# Patient Record
Sex: Female | Born: 1945 | Race: Black or African American | Hispanic: No | Marital: Married | Smoking: Never smoker
Health system: Southern US, Community
[De-identification: ages and names within clinical notes are randomized; demographics above are authoritative.]

## PROBLEM LIST (undated history)

## (undated) DIAGNOSIS — I10 Essential (primary) hypertension: Secondary | ICD-10-CM

## (undated) HISTORY — PX: BREAST CYST ASPIRATION: SHX578

## (undated) HISTORY — PX: ABDOMINAL HYSTERECTOMY: SHX81

---

## 2010-02-26 ENCOUNTER — Ambulatory Visit: Payer: Self-pay

## 2010-03-11 ENCOUNTER — Ambulatory Visit: Payer: Self-pay

## 2011-08-10 ENCOUNTER — Ambulatory Visit: Payer: Self-pay | Admitting: Family Medicine

## 2013-02-22 ENCOUNTER — Ambulatory Visit: Payer: Self-pay

## 2013-03-15 ENCOUNTER — Ambulatory Visit: Payer: Self-pay

## 2014-05-31 ENCOUNTER — Ambulatory Visit: Payer: Self-pay | Admitting: Family Medicine

## 2016-02-05 ENCOUNTER — Other Ambulatory Visit: Payer: Self-pay | Admitting: Family Medicine

## 2016-02-05 DIAGNOSIS — Z139 Encounter for screening, unspecified: Secondary | ICD-10-CM

## 2016-02-21 ENCOUNTER — Other Ambulatory Visit: Payer: Self-pay | Admitting: Family Medicine

## 2016-02-21 ENCOUNTER — Ambulatory Visit
Admission: RE | Admit: 2016-02-21 | Discharge: 2016-02-21 | Disposition: A | Discharge status: Home / Self Care | Payer: Medicare HMO | Source: Ambulatory Visit | Attending: Family Medicine | Admitting: Family Medicine

## 2016-02-21 DIAGNOSIS — Z139 Encounter for screening, unspecified: Secondary | ICD-10-CM

## 2016-02-21 DIAGNOSIS — Z1231 Encounter for screening mammogram for malignant neoplasm of breast: Secondary | ICD-10-CM | POA: Insufficient documentation

## 2016-06-29 ENCOUNTER — Other Ambulatory Visit: Payer: Self-pay | Admitting: Family Medicine

## 2016-06-29 DIAGNOSIS — Z13820 Encounter for screening for osteoporosis: Secondary | ICD-10-CM

## 2016-07-22 ENCOUNTER — Ambulatory Visit
Admission: RE | Admit: 2016-07-22 | Discharge: 2016-07-22 | Disposition: A | Discharge status: Home / Self Care | Payer: Medicare HMO | Source: Ambulatory Visit | Attending: Family Medicine | Admitting: Family Medicine

## 2016-07-22 DIAGNOSIS — Z13820 Encounter for screening for osteoporosis: Secondary | ICD-10-CM | POA: Insufficient documentation

## 2016-07-22 DIAGNOSIS — M85852 Other specified disorders of bone density and structure, left thigh: Secondary | ICD-10-CM | POA: Insufficient documentation

## 2017-01-15 ENCOUNTER — Other Ambulatory Visit: Payer: Self-pay | Admitting: Family Medicine

## 2017-01-15 DIAGNOSIS — Z1231 Encounter for screening mammogram for malignant neoplasm of breast: Secondary | ICD-10-CM

## 2017-02-23 ENCOUNTER — Ambulatory Visit
Admission: RE | Admit: 2017-02-23 | Discharge: 2017-02-23 | Disposition: A | Discharge status: Home / Self Care | Payer: Medicare HMO | Source: Ambulatory Visit | Attending: Family Medicine | Admitting: Family Medicine

## 2017-02-23 DIAGNOSIS — Z1231 Encounter for screening mammogram for malignant neoplasm of breast: Secondary | ICD-10-CM | POA: Diagnosis present

## 2018-01-17 ENCOUNTER — Other Ambulatory Visit: Payer: Self-pay | Admitting: Family Medicine

## 2018-01-17 DIAGNOSIS — Z1231 Encounter for screening mammogram for malignant neoplasm of breast: Secondary | ICD-10-CM

## 2018-02-25 ENCOUNTER — Ambulatory Visit
Admission: RE | Admit: 2018-02-25 | Discharge: 2018-02-25 | Disposition: A | Discharge status: Home / Self Care | Payer: Medicare HMO | Source: Ambulatory Visit | Attending: Family Medicine | Admitting: Family Medicine

## 2018-02-25 DIAGNOSIS — Z1231 Encounter for screening mammogram for malignant neoplasm of breast: Secondary | ICD-10-CM | POA: Diagnosis not present

## 2018-09-15 ENCOUNTER — Other Ambulatory Visit: Payer: Self-pay | Admitting: Family Medicine

## 2018-09-15 DIAGNOSIS — Z13820 Encounter for screening for osteoporosis: Secondary | ICD-10-CM

## 2018-10-19 ENCOUNTER — Ambulatory Visit
Admission: RE | Admit: 2018-10-19 | Discharge: 2018-10-19 | Disposition: A | Discharge status: Home / Self Care | Payer: Medicare HMO | Source: Ambulatory Visit | Attending: Family Medicine | Admitting: Family Medicine

## 2018-10-19 DIAGNOSIS — M85852 Other specified disorders of bone density and structure, left thigh: Secondary | ICD-10-CM | POA: Insufficient documentation

## 2018-10-19 DIAGNOSIS — Z13820 Encounter for screening for osteoporosis: Secondary | ICD-10-CM

## 2018-10-19 DIAGNOSIS — M8588 Other specified disorders of bone density and structure, other site: Secondary | ICD-10-CM | POA: Insufficient documentation

## 2019-01-18 ENCOUNTER — Other Ambulatory Visit: Payer: Self-pay | Admitting: Family Medicine

## 2019-01-18 DIAGNOSIS — Z1231 Encounter for screening mammogram for malignant neoplasm of breast: Secondary | ICD-10-CM

## 2019-06-06 ENCOUNTER — Other Ambulatory Visit: Payer: Self-pay

## 2019-06-06 ENCOUNTER — Ambulatory Visit
Admission: RE | Admit: 2019-06-06 | Discharge: 2019-06-06 | Disposition: A | Discharge status: Home / Self Care | Payer: Medicare HMO | Source: Ambulatory Visit | Attending: Family Medicine | Admitting: Family Medicine

## 2019-06-06 DIAGNOSIS — Z1231 Encounter for screening mammogram for malignant neoplasm of breast: Secondary | ICD-10-CM

## 2020-04-30 ENCOUNTER — Other Ambulatory Visit: Payer: Self-pay | Admitting: Family Medicine

## 2020-04-30 DIAGNOSIS — Z1231 Encounter for screening mammogram for malignant neoplasm of breast: Secondary | ICD-10-CM

## 2020-06-10 ENCOUNTER — Ambulatory Visit
Admission: RE | Admit: 2020-06-10 | Discharge: 2020-06-10 | Disposition: A | Discharge status: Home / Self Care | Payer: Medicare HMO | Source: Ambulatory Visit | Attending: Family Medicine | Admitting: Family Medicine

## 2020-06-10 DIAGNOSIS — Z1231 Encounter for screening mammogram for malignant neoplasm of breast: Secondary | ICD-10-CM | POA: Diagnosis present

## 2021-05-14 ENCOUNTER — Other Ambulatory Visit: Payer: Self-pay | Admitting: Family Medicine

## 2021-05-14 DIAGNOSIS — Z1231 Encounter for screening mammogram for malignant neoplasm of breast: Secondary | ICD-10-CM

## 2021-06-11 ENCOUNTER — Other Ambulatory Visit: Payer: Self-pay

## 2021-06-11 ENCOUNTER — Ambulatory Visit
Admission: RE | Admit: 2021-06-11 | Discharge: 2021-06-11 | Disposition: A | Discharge status: Home / Self Care | Payer: Medicare HMO | Source: Ambulatory Visit | Attending: Family Medicine | Admitting: Family Medicine

## 2021-06-11 DIAGNOSIS — Z1231 Encounter for screening mammogram for malignant neoplasm of breast: Secondary | ICD-10-CM | POA: Diagnosis present

## 2021-06-17 ENCOUNTER — Other Ambulatory Visit: Payer: Self-pay | Admitting: Family Medicine

## 2021-06-17 DIAGNOSIS — R928 Other abnormal and inconclusive findings on diagnostic imaging of breast: Secondary | ICD-10-CM

## 2021-06-17 DIAGNOSIS — N6489 Other specified disorders of breast: Secondary | ICD-10-CM

## 2021-06-19 ENCOUNTER — Ambulatory Visit
Admission: RE | Admit: 2021-06-19 | Discharge: 2021-06-19 | Disposition: A | Discharge status: Home / Self Care | Payer: Medicare HMO | Source: Ambulatory Visit | Attending: Family Medicine | Admitting: Family Medicine

## 2021-06-19 ENCOUNTER — Other Ambulatory Visit: Payer: Self-pay

## 2021-06-19 DIAGNOSIS — R928 Other abnormal and inconclusive findings on diagnostic imaging of breast: Secondary | ICD-10-CM

## 2021-06-19 DIAGNOSIS — N6489 Other specified disorders of breast: Secondary | ICD-10-CM | POA: Insufficient documentation

## 2021-09-15 ENCOUNTER — Other Ambulatory Visit: Payer: Self-pay | Admitting: Family Medicine

## 2021-09-15 DIAGNOSIS — Z13820 Encounter for screening for osteoporosis: Secondary | ICD-10-CM

## 2021-11-15 IMAGING — MG MM DIGITAL DIAGNOSTIC UNILAT*L* W/ TOMO W/ CAD
4 series · 4 of 12 positions shown · non-contrast
Comparison: Previous exam(s).

CLINICAL DATA: Callback for LEFT breast asymmetry seen on MLO view
only

EXAM:
DIGITAL DIAGNOSTIC UNILATERAL LEFT MAMMOGRAM WITH TOMOSYNTHESIS AND
CAD
TECHNIQUE: Left digital diagnostic mammography and breast tomosynthesis was
performed. The images were evaluated with computer-aided detection.

[L MLO synth-2D]
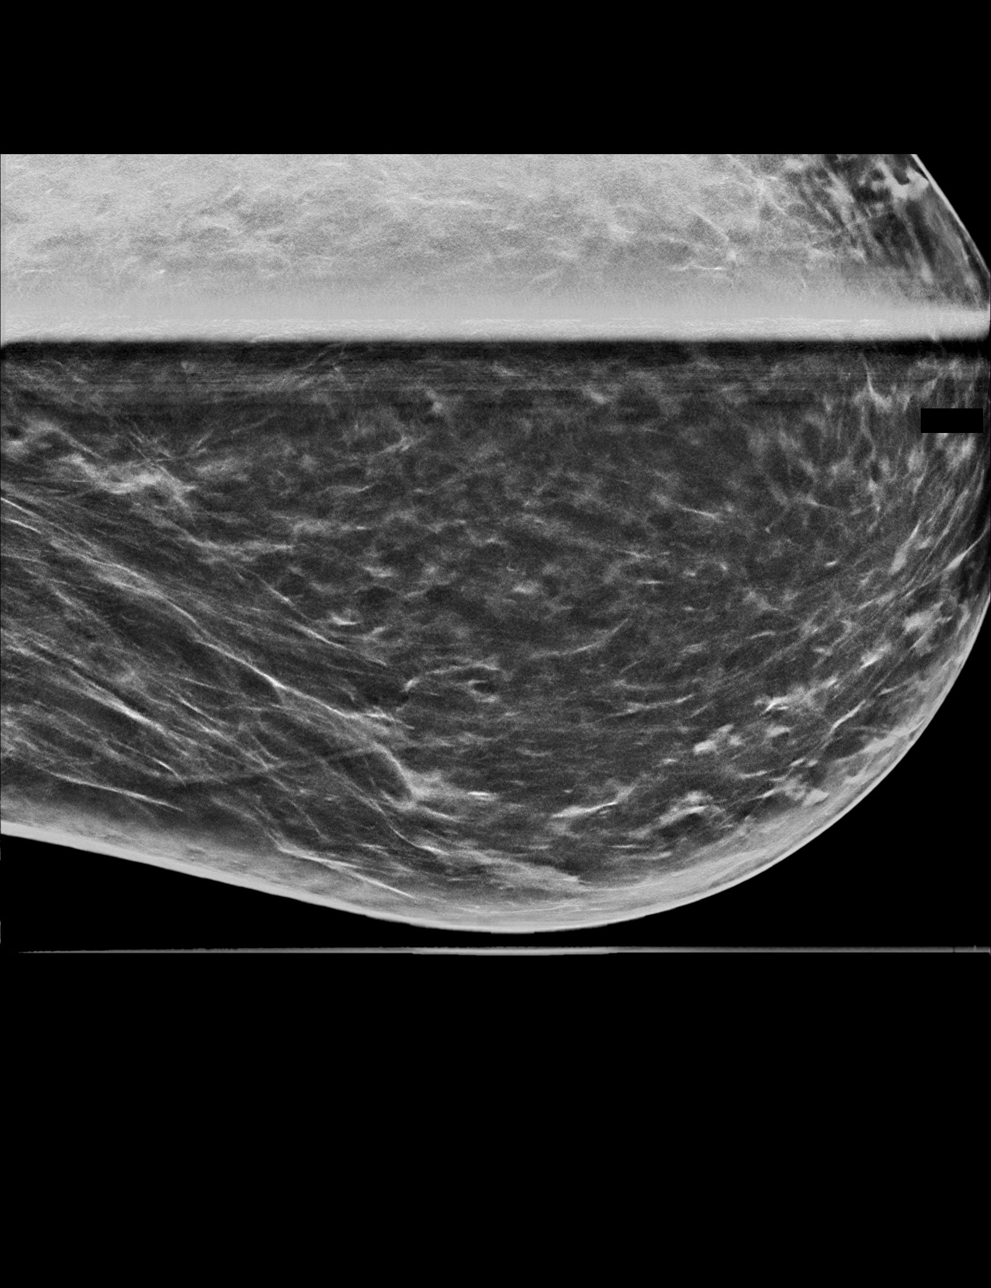

[L ML synth-2D]
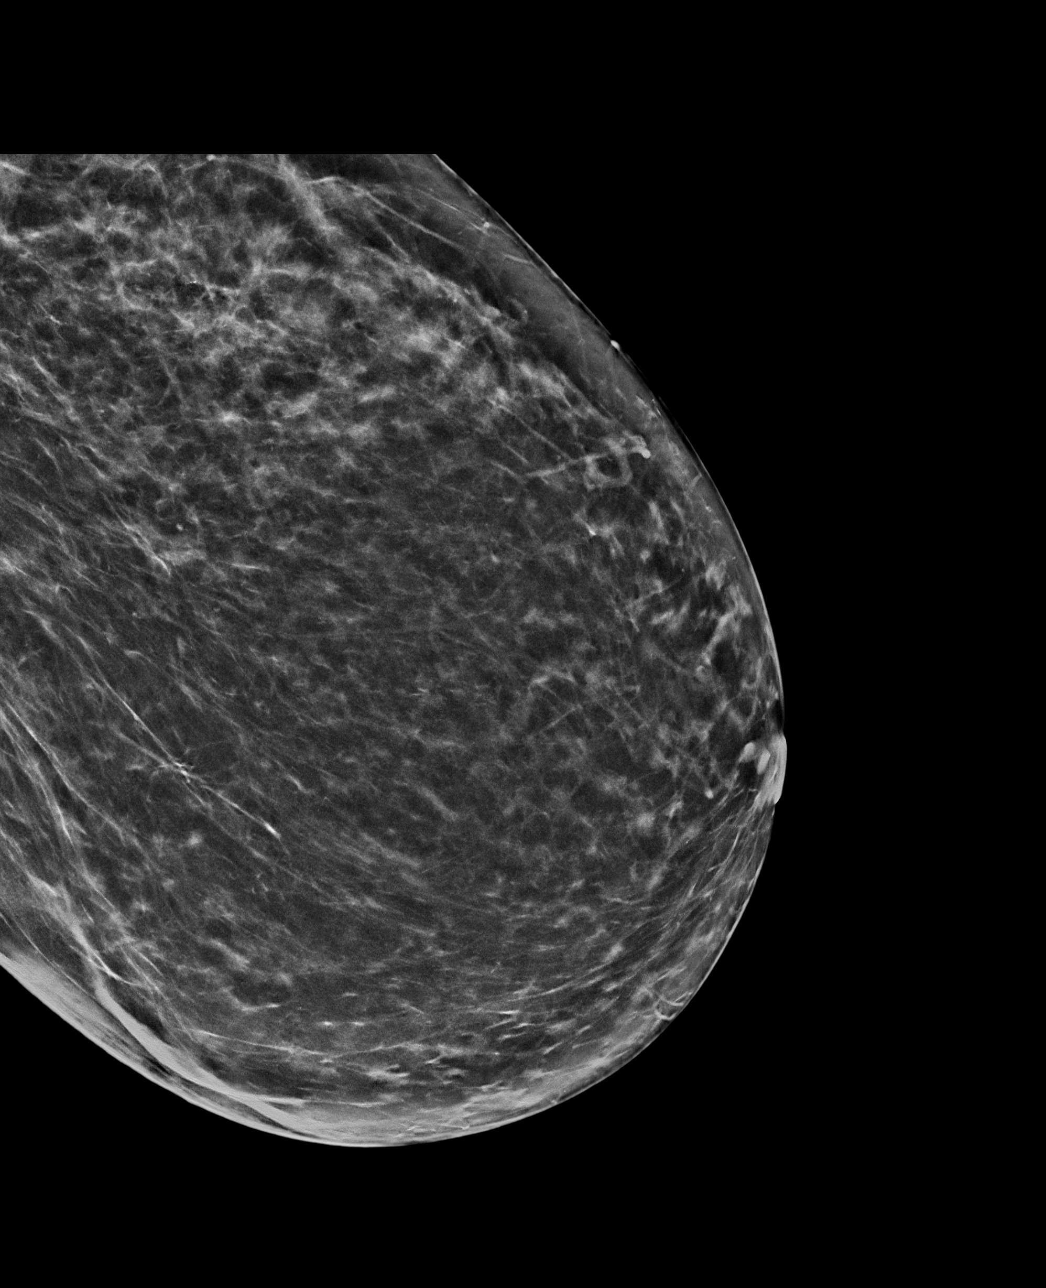

[L ML tomo · tomo slice 37/72.0]
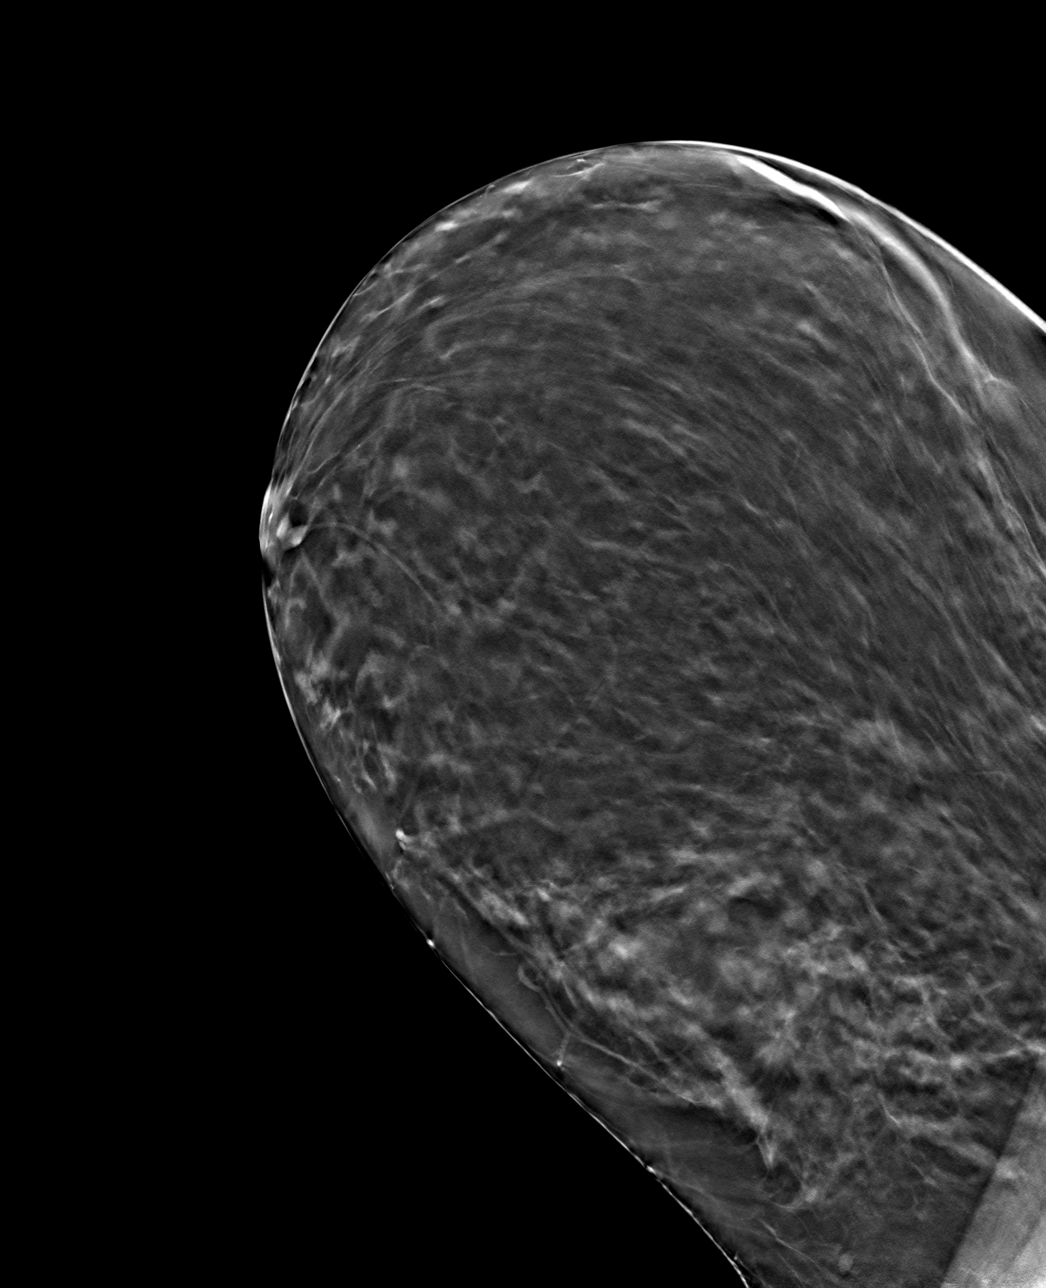

[L MLO tomo · tomo slice 35/70.0]
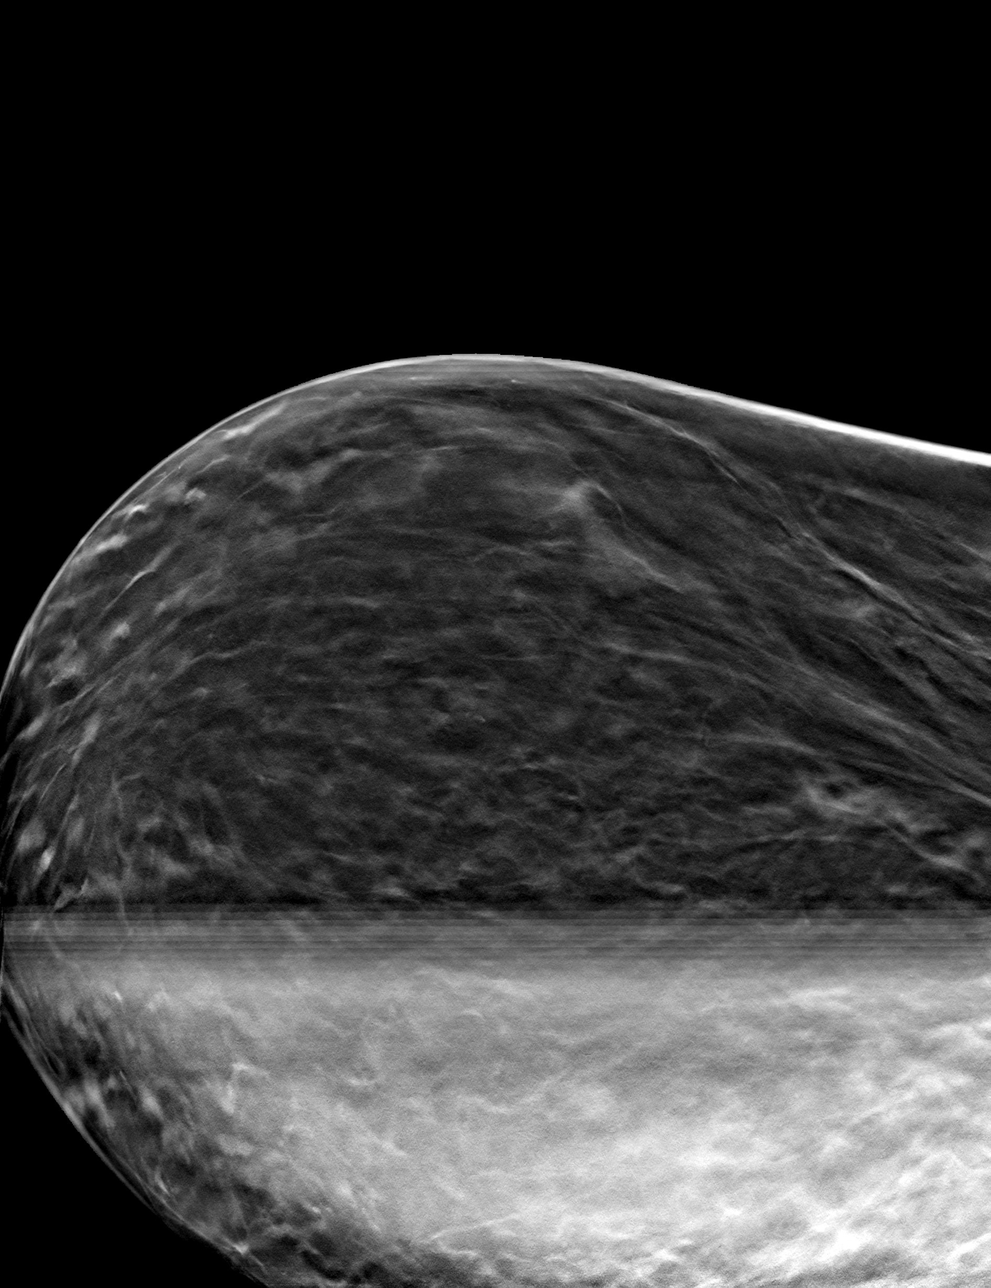

[4 of 12 positions shown; findings below may reference images not displayed]

ACR Breast Density Category c: The breast tissue is heterogeneously
dense, which may obscure small masses.
FINDINGS: The previously described finding does not persist with additional
views, consistent with superimposed fibroglandular tissue.
Fibroglandular tissue assumes a configuration stable in comparison
to remote prior mammograms. No suspicious mass, microcalcification,
or other finding is identified.
IMPRESSION: No mammographic evidence of malignancy.

RECOMMENDATION:
Screening mammogram in one year.(Code:X2-L-3OF)

I have discussed the findings and recommendations with the patient.
If applicable, a reminder letter will be sent to the patient
regarding the next appointment.

BI-RADS CATEGORY  1: Negative.

## 2021-11-17 DIAGNOSIS — E7800 Pure hypercholesterolemia, unspecified: Secondary | ICD-10-CM | POA: Insufficient documentation

## 2021-11-17 DIAGNOSIS — I10 Essential (primary) hypertension: Secondary | ICD-10-CM | POA: Insufficient documentation

## 2022-05-12 ENCOUNTER — Other Ambulatory Visit: Payer: Self-pay | Admitting: Family Medicine

## 2022-05-12 DIAGNOSIS — Z1231 Encounter for screening mammogram for malignant neoplasm of breast: Secondary | ICD-10-CM

## 2022-06-12 ENCOUNTER — Ambulatory Visit
Admission: RE | Admit: 2022-06-12 | Discharge: 2022-06-12 | Disposition: A | Discharge status: Home / Self Care | Payer: Medicare HMO | Source: Ambulatory Visit | Attending: Family Medicine | Admitting: Family Medicine

## 2022-06-12 DIAGNOSIS — Z1231 Encounter for screening mammogram for malignant neoplasm of breast: Secondary | ICD-10-CM | POA: Diagnosis present

## 2023-03-26 ENCOUNTER — Other Ambulatory Visit: Payer: Self-pay

## 2023-03-30 ENCOUNTER — Other Ambulatory Visit: Payer: Self-pay

## 2023-03-30 ENCOUNTER — Telehealth: Payer: Self-pay

## 2023-03-30 DIAGNOSIS — Z1211 Encounter for screening for malignant neoplasm of colon: Secondary | ICD-10-CM

## 2023-03-30 MED ORDER — NA SULFATE-K SULFATE-MG SULF 17.5-3.13-1.6 GM/177ML PO SOLN: 1.0000 | Freq: Once | ORAL | 0 refills | Status: AC

## 2023-03-30 NOTE — Telephone Encounter (Signed)
Gastroenterology Pre-Procedure Review  Request Date: 04/21/23 Requesting Physician: Dr. Tobi Bastos  PATIENT REVIEW QUESTIONS: The patient responded to the following health history questions as indicated:    1. Are you having any GI issues? no 2. Do you have a personal history of Polyps? no 3. Do you have a family history of Colon Cancer or Polyps? no 4. Diabetes Mellitus? no 5. Joint replacements in the past 12 months?no 6. Major health problems in the past 3 months?no 7. Any artificial heart valves, MVP, or defibrillator?no    MEDICATIONS & ALLERGIES:    Patient reports the following regarding taking any anticoagulation/antiplatelet therapy:   Plavix, Coumadin, Eliquis, Xarelto, Lovenox, Pradaxa, Brilinta, or Effient? no Aspirin? no  Patient confirms/reports the following medications:  No current outpatient medications on file.   No current facility-administered medications for this visit.    Patient confirms/reports the following allergies:  No Known Allergies  No orders of the defined types were placed in this encounter.   AUTHORIZATION INFORMATION Primary Insurance: 1D#: Group #:  Secondary Insurance: 1D#: Group #:  SCHEDULE INFORMATION: Date: 04/21/23 Time: Location: [facility]

## 2023-04-21 ENCOUNTER — Ambulatory Visit: Payer: Medicare HMO

## 2023-04-21 ENCOUNTER — Encounter
Admission: RE | Disposition: A | Discharge status: Home / Self Care | Payer: Self-pay | Source: Home / Self Care | Attending: Gastroenterology

## 2023-04-21 ENCOUNTER — Encounter: Payer: Self-pay | Admitting: Gastroenterology

## 2023-04-21 ENCOUNTER — Other Ambulatory Visit: Payer: Self-pay

## 2023-04-21 ENCOUNTER — Ambulatory Visit
Admission: RE | Admit: 2023-04-21 | Discharge: 2023-04-21 | Disposition: A | Discharge status: Home / Self Care | Payer: Medicare HMO | Attending: Gastroenterology | Admitting: Gastroenterology

## 2023-04-21 DIAGNOSIS — D126 Benign neoplasm of colon, unspecified: Secondary | ICD-10-CM | POA: Diagnosis not present

## 2023-04-21 DIAGNOSIS — Z90710 Acquired absence of both cervix and uterus: Secondary | ICD-10-CM | POA: Insufficient documentation

## 2023-04-21 DIAGNOSIS — Z1211 Encounter for screening for malignant neoplasm of colon: Secondary | ICD-10-CM

## 2023-04-21 DIAGNOSIS — I10 Essential (primary) hypertension: Secondary | ICD-10-CM | POA: Diagnosis not present

## 2023-04-21 DIAGNOSIS — D122 Benign neoplasm of ascending colon: Secondary | ICD-10-CM | POA: Insufficient documentation

## 2023-04-21 HISTORY — PX: COLONOSCOPY WITH PROPOFOL: SHX5780

## 2023-04-21 HISTORY — DX: Essential (primary) hypertension: I10

## 2023-04-21 SURGERY — COLONOSCOPY WITH PROPOFOL
Anesthesia: General

## 2023-04-21 MED ORDER — PROPOFOL 500 MG/50ML IV EMUL
INTRAVENOUS | Status: DC | PRN
  Administered 2023-04-21: 125 ug/kg/min via INTRAVENOUS

## 2023-04-21 MED ORDER — EPHEDRINE SULFATE (PRESSORS) 50 MG/ML IJ SOLN
INTRAMUSCULAR | Status: DC | PRN
  Administered 2023-04-21: 10 mg via INTRAVENOUS

## 2023-04-21 MED ORDER — LIDOCAINE HCL (CARDIAC) PF 100 MG/5ML IV SOSY
PREFILLED_SYRINGE | INTRAVENOUS | Status: DC | PRN
  Administered 2023-04-21: 50 mg via INTRAVENOUS

## 2023-04-21 MED ORDER — PROPOFOL 1000 MG/100ML IV EMUL
INTRAVENOUS | Status: AC
  Filled 2023-04-21: qty 200

## 2023-04-21 MED ORDER — ONDANSETRON HCL 4 MG/2ML IJ SOLN
INTRAMUSCULAR | Status: AC
  Filled 2023-04-21: qty 2

## 2023-04-21 MED ORDER — GLYCOPYRROLATE 0.2 MG/ML IJ SOLN
INTRAMUSCULAR | Status: AC
  Filled 2023-04-21: qty 1

## 2023-04-21 MED ORDER — PROPOFOL 10 MG/ML IV BOLUS
INTRAVENOUS | Status: DC | PRN
  Administered 2023-04-21 (×2): 20 mg via INTRAVENOUS
  Administered 2023-04-21: 60 mg via INTRAVENOUS

## 2023-04-21 MED ORDER — NALOXONE HCL 0.4 MG/ML IJ SOLN
INTRAMUSCULAR | Status: AC
  Filled 2023-04-21: qty 1

## 2023-04-21 MED ORDER — SUCCINYLCHOLINE CHLORIDE 200 MG/10ML IV SOSY
PREFILLED_SYRINGE | INTRAVENOUS | Status: AC
  Filled 2023-04-21: qty 10

## 2023-04-21 MED ORDER — LIDOCAINE HCL (PF) 2 % IJ SOLN
INTRAMUSCULAR | Status: AC
  Filled 2023-04-21: qty 15

## 2023-04-21 MED ORDER — ESMOLOL HCL 100 MG/10ML IV SOLN
INTRAVENOUS | Status: AC
  Filled 2023-04-21: qty 10

## 2023-04-21 MED ORDER — PHENYLEPHRINE 80 MCG/ML (10ML) SYRINGE FOR IV PUSH (FOR BLOOD PRESSURE SUPPORT)
PREFILLED_SYRINGE | INTRAVENOUS | Status: AC
  Filled 2023-04-21: qty 40

## 2023-04-21 MED ORDER — LIDOCAINE HCL (PF) 2 % IJ SOLN
INTRAMUSCULAR | Status: AC
  Filled 2023-04-21: qty 5

## 2023-04-21 MED ORDER — SODIUM CHLORIDE 0.9 % IV SOLN: INTRAVENOUS | Status: DC

## 2023-04-21 NOTE — Transfer of Care (Signed)
Immediate Anesthesia Transfer of Care Note  Patient: Jacqueline Patton  Procedure(s) Performed: COLONOSCOPY WITH PROPOFOL  Patient Location: Endoscopy Unit  Anesthesia Type:General  Level of Consciousness: drowsy  Airway & Oxygen Therapy: Patient Spontanous Breathing and Patient connected to face mask oxygen  Post-op Assessment: Report given to RN and Post -op Vital signs reviewed and stable  Post vital signs: Reviewed and stable  Last Vitals:  Vitals Value Taken Time  BP 98/56 04/21/23 0808  Temp    Pulse 76 04/21/23 0808  Resp 19 04/21/23 0808  SpO2 100 % 04/21/23 0808  Vitals shown include unvalidated device data.  Last Pain:  Vitals:   04/21/23 0805  TempSrc: Temporal  PainSc: Asleep         Complications: No notable events documented.

## 2023-04-21 NOTE — Anesthesia Preprocedure Evaluation (Signed)
Anesthesia Evaluation  Patient identified by MRN, date of birth, ID band Patient awake    Reviewed: Allergy & Precautions, NPO status , Patient's Chart, lab work & pertinent test results  History of Anesthesia Complications Negative for: history of anesthetic complications  Airway Mallampati: III  TM Distance: >3 FB Neck ROM: full    Dental  (+) Chipped   Pulmonary neg pulmonary ROS, neg shortness of breath   Pulmonary exam normal        Cardiovascular Exercise Tolerance: Good hypertension, (-) angina Normal cardiovascular exam     Neuro/Psych negative neurological ROS  negative psych ROS   GI/Hepatic negative GI ROS, Neg liver ROS,neg GERD  ,,  Endo/Other  negative endocrine ROS    Renal/GU negative Renal ROS  negative genitourinary   Musculoskeletal   Abdominal   Peds  Hematology negative hematology ROS (+)   Anesthesia Other Findings Past Medical History: No date: Hypertension  Past Surgical History: No date: ABDOMINAL HYSTERECTOMY No date: BREAST CYST ASPIRATION; Left  BMI    Body Mass Index: 25.06 kg/m      Reproductive/Obstetrics negative OB ROS                             Anesthesia Physical Anesthesia Plan  ASA: 2  Anesthesia Plan: General   Post-op Pain Management:    Induction: Intravenous  PONV Risk Score and Plan: Propofol infusion and TIVA  Airway Management Planned: Natural Airway and Nasal Cannula  Additional Equipment:   Intra-op Plan:   Post-operative Plan:   Informed Consent: I have reviewed the patients History and Physical, chart, labs and discussed the procedure including the risks, benefits and alternatives for the proposed anesthesia with the patient or authorized representative who has indicated his/her understanding and acceptance.     Dental Advisory Given  Plan Discussed with: Anesthesiologist, CRNA and Surgeon  Anesthesia Plan  Comments: (Patient consented for risks of anesthesia including but not limited to:  - adverse reactions to medications - risk of airway placement if required - damage to eyes, teeth, lips or other oral mucosa - nerve damage due to positioning  - sore throat or hoarseness - Damage to heart, brain, nerves, lungs, other parts of body or loss of life  Patient voiced understanding.)       Anesthesia Quick Evaluation

## 2023-04-21 NOTE — Anesthesia Procedure Notes (Signed)
Procedure Name: General with mask airway Date/Time: 04/21/2023 7:49 AM  Performed by: Lily Lovings, CRNAPre-anesthesia Checklist: Patient identified, Emergency Drugs available, Suction available, Patient being monitored and Timeout performed Patient Re-evaluated:Patient Re-evaluated prior to induction Oxygen Delivery Method: Simple face mask Induction Type: IV induction

## 2023-04-21 NOTE — H&P (Signed)
     Wyline Mood, MD [address], [address], [place], [place], [address] [address], [place], [place], [address] Phone: [phone]  Fax: [phone]  Primary Care Physician:  Leanna Sato, MD   Pre-Procedure History & Physical: HPI:  Jacqueline Patton is a 77 y.o. female is here for an colonoscopy.   Past Medical History:  Diagnosis Date   Hypertension     Past Surgical History:  Procedure Laterality Date   ABDOMINAL HYSTERECTOMY     BREAST CYST ASPIRATION Left     Prior to Admission medications   Medication Sig Start Date End Date Taking? Authorizing Provider  amLODipine (NORVASC) 10 MG tablet Take 10 mg by mouth daily.   Yes [provider]  hydrochlorothiazide (HYDRODIURIL) 25 MG tablet Take 25 mg by mouth daily.   Yes [provider]  metoprolol succinate (TOPROL-XL) 50 MG 24 hr tablet Take by mouth. 09/22/22  Yes [provider]  Multiple Vitamin (MULTI-VITAMIN) tablet Take by mouth.   Yes [provider]  lovastatin (MEVACOR) 20 MG tablet SMARTSIG:1 Tablet(s) By Mouth Every Evening    [provider]    Allergies as of 03/30/2023   (No Known Allergies)    Family History  Problem Relation Age of Onset   Breast cancer Neg Hx     Social History   Socioeconomic History   Marital status: Married    Spouse name: Not on file   Number of children: Not on file   Years of education: Not on file   Highest education level: Not on file  Occupational History   Not on file  Tobacco Use   Smoking status: Never   Smokeless tobacco: Never  Vaping Use   Vaping Use: Never used  Substance and Sexual Activity   Alcohol use: Never   Drug use: Never   Sexual activity: Not on file  Other Topics Concern   Not on file  Social History Narrative   Not on file   Social Determinants of Health   Financial Resource Strain: Not on file  Food Insecurity: Not on file  Transportation Needs: Not on file  Physical  Activity: Not on file  Stress: Not on file  Social Connections: Not on file  Intimate Partner Violence: Not on file    Review of Systems: See HPI, otherwise negative ROS  Physical Exam: BP (!) 153/74   Pulse 78   Temp (!) 96.5 F (35.8 C) (Temporal)   Ht 5\' 2"  (1.575 m)   Wt 62.1 kg   SpO2 100%   BMI 25.06 kg/m  General:   Alert,  pleasant and cooperative in NAD Head:  Normocephalic and atraumatic. Neck:  Supple; no masses or thyromegaly. Lungs:  Clear throughout to auscultation, normal respiratory effort.    Heart:  +S1, +S2, Regular rate and rhythm, No edema. Abdomen:  Soft, nontender and nondistended. Normal bowel sounds, without guarding, and without rebound.   Neurologic:  Alert and  oriented x4;  grossly normal neurologically.  Impression/Plan: Jacqueline Patton is here for an colonoscopy to be performed for Screening colonoscopy average risk   Risks, benefits, limitations, and alternatives regarding  colonoscopy have been reviewed with the patient.  Questions have been answered.  All parties agreeable.   Wyline Mood, MD  04/21/2023, 7:46 AM\

## 2023-04-21 NOTE — Op Note (Signed)
[facility] Gastroenterology Patient Name: Jacqueline Patton Procedure Date: 04/21/2023 7:13 AM MRN: [id number] Account #: [id number] Date of Birth: 12-08-1945 Admit Type: Outpatient Age: 77 Room: [facility] ENDO ROOM 3 Gender: Female Note Status: Finalized Instrument Name: Prentice Docker [id number] Procedure:             Colonoscopy Indications:           Screening for colorectal malignant neoplasm Providers:             Wyline Mood MD, MD Referring MD:          No Local Md, MD (Referring MD) Medicines:             Monitored Anesthesia Care Complications:         No immediate complications. Procedure:             Pre-Anesthesia Assessment:                        - Prior to the procedure, a History and Physical was                         performed, and patient medications, allergies and                         sensitivities were reviewed. The patient's tolerance                         of previous anesthesia was reviewed.                        - The risks and benefits of the procedure and the                         sedation options and risks were discussed with the                         patient. All questions were answered and informed                         consent was obtained.                        - ASA Grade Assessment: II - A patient with mild                         systemic disease.                        After obtaining informed consent, the colonoscope was                         passed under direct vision. Throughout the procedure,                         the patient's blood pressure, pulse, and oxygen                         saturations were monitored continuously. The                         Colonoscope was  introduced through the anus and                         advanced to the the cecum, identified by the                         appendiceal orifice. The colonoscopy was performed                         with ease. The patient tolerated the procedure well.                          The quality of the bowel preparation was excellent.                         The ileocecal valve, appendiceal orifice, and rectum                         were photographed. Findings:      The perianal and digital rectal examinations were normal.      A 5 mm polyp was found in the ascending colon. The polyp was sessile.       The polyp was removed with a cold snare. Resection and retrieval were       complete.      The exam was otherwise without abnormality on direct and retroflexion       views. Impression:            - One 5 mm polyp in the ascending colon, removed with                         a cold snare. Resected and retrieved.                        - The examination was otherwise normal on direct and                         retroflexion views. Recommendation:        - Discharge patient to home (with escort).                        - Resume previous diet.                        - Continue present medications.                        - Await pathology results.                        - Repeat colonoscopy is not recommended due to current                         age (55 years or older) for surveillance based on                         pathology results. Procedure Code(s):     --- Professional ---                        [phone],  Colonoscopy, flexible; with removal of                         tumor(s), polyp(s), or other lesion(s) by snare                         technique Diagnosis Code(s):     --- Professional ---                        Z12.11, Encounter for screening for malignant neoplasm                         of colon                        D12.2, Benign neoplasm of ascending colon CPT copyright 2022 [employer]. All rights reserved. The codes documented in this report are preliminary and upon coder review may  be revised to meet current compliance requirements. Wyline Mood, MD Wyline Mood MD, MD 04/21/2023 8:02:54 AM This report has been signed  electronically. Number of Addenda: 0 Note Initiated On: 04/21/2023 7:13 AM Scope Withdrawal Time: 0 hours 7 minutes 54 seconds  Total Procedure Duration: 0 hours 10 minutes 28 seconds  Estimated Blood Loss:  Estimated blood loss: none.      [facility]

## 2023-04-21 NOTE — Anesthesia Postprocedure Evaluation (Signed)
Anesthesia Post Note  Patient: Jacqueline Patton  Procedure(s) Performed: COLONOSCOPY WITH PROPOFOL  Patient location during evaluation: Endoscopy Anesthesia Type: General Level of consciousness: awake and alert Pain management: pain level controlled Vital Signs Assessment: post-procedure vital signs reviewed and stable Respiratory status: spontaneous breathing, nonlabored ventilation, respiratory function stable and patient connected to nasal cannula oxygen Cardiovascular status: blood pressure returned to baseline and stable Postop Assessment: no apparent nausea or vomiting Anesthetic complications: no   No notable events documented.   Last Vitals:  Vitals:   04/21/23 0805 04/21/23 0815  BP: (!) 82/46 (!) 128/54  Pulse: (!) 57   Resp: 15   Temp: (!) 36.2 C   SpO2: 100%     Last Pain:  Vitals:   04/21/23 0825  TempSrc:   PainSc: 0-No pain                 Cleda Mccreedy Correna Meacham

## 2023-04-22 ENCOUNTER — Encounter: Payer: Self-pay | Admitting: Gastroenterology

## 2023-04-29 ENCOUNTER — Other Ambulatory Visit: Payer: Self-pay | Admitting: Family Medicine

## 2023-04-29 DIAGNOSIS — Z1231 Encounter for screening mammogram for malignant neoplasm of breast: Secondary | ICD-10-CM

## 2023-06-14 ENCOUNTER — Ambulatory Visit
Admission: RE | Admit: 2023-06-14 | Discharge: 2023-06-14 | Disposition: A | Discharge status: Home / Self Care | Payer: Medicare HMO | Source: Ambulatory Visit | Attending: Family Medicine | Admitting: Family Medicine

## 2023-06-14 DIAGNOSIS — Z1231 Encounter for screening mammogram for malignant neoplasm of breast: Secondary | ICD-10-CM | POA: Insufficient documentation

## 2023-08-05 ENCOUNTER — Other Ambulatory Visit: Payer: Self-pay | Admitting: Family Medicine

## 2023-08-05 DIAGNOSIS — Z780 Asymptomatic menopausal state: Secondary | ICD-10-CM

## 2023-08-19 ENCOUNTER — Ambulatory Visit
Admission: RE | Admit: 2023-08-19 | Discharge: 2023-08-19 | Disposition: A | Discharge status: Home / Self Care | Payer: Medicare HMO | Source: Ambulatory Visit | Attending: Family Medicine | Admitting: Family Medicine

## 2023-08-19 DIAGNOSIS — Z780 Asymptomatic menopausal state: Secondary | ICD-10-CM | POA: Diagnosis present

## 2024-05-01 ENCOUNTER — Other Ambulatory Visit: Payer: Self-pay | Admitting: Family Medicine

## 2024-05-01 DIAGNOSIS — Z1231 Encounter for screening mammogram for malignant neoplasm of breast: Secondary | ICD-10-CM

## 2024-06-14 ENCOUNTER — Ambulatory Visit
Admission: RE | Admit: 2024-06-14 | Discharge: 2024-06-14 | Disposition: A | Discharge status: Home / Self Care | Source: Ambulatory Visit | Attending: Family Medicine | Admitting: Family Medicine

## 2024-06-14 DIAGNOSIS — Z1231 Encounter for screening mammogram for malignant neoplasm of breast: Secondary | ICD-10-CM | POA: Diagnosis present
